# Patient Record
Sex: Female | Born: 1995 | Race: White | Hispanic: No | Marital: Single | Smoking: Never smoker
Health system: Southern US, Community
[De-identification: ages and names within clinical notes are randomized; demographics above are authoritative.]

---

## 2015-11-06 ENCOUNTER — Emergency Department
Admission: EM | Admit: 2015-11-06 | Discharge: 2015-11-06 | Disposition: A | Discharge status: Home / Self Care | Payer: 59 | Attending: Emergency Medicine | Admitting: Emergency Medicine

## 2015-11-06 ENCOUNTER — Encounter: Payer: Self-pay | Admitting: Emergency Medicine

## 2015-11-06 DIAGNOSIS — R197 Diarrhea, unspecified: Secondary | ICD-10-CM | POA: Insufficient documentation

## 2015-11-06 DIAGNOSIS — K5900 Constipation, unspecified: Secondary | ICD-10-CM | POA: Insufficient documentation

## 2015-11-06 DIAGNOSIS — N390 Urinary tract infection, site not specified: Secondary | ICD-10-CM

## 2015-11-06 DIAGNOSIS — R110 Nausea: Secondary | ICD-10-CM | POA: Diagnosis not present

## 2015-11-06 DIAGNOSIS — F419 Anxiety disorder, unspecified: Secondary | ICD-10-CM | POA: Insufficient documentation

## 2015-11-06 DIAGNOSIS — R252 Cramp and spasm: Secondary | ICD-10-CM | POA: Diagnosis not present

## 2015-11-06 DIAGNOSIS — R109 Unspecified abdominal pain: Secondary | ICD-10-CM | POA: Diagnosis present

## 2015-11-06 LAB — CBC WITH DIFFERENTIAL/PLATELET
BASOS ABS: 0 10*3/uL (ref 0–0.1)
BASOS PCT: 1 %
EOS PCT: 0 %
Eosinophils Absolute: 0 10*3/uL (ref 0–0.7)
HCT: 41 % (ref 35.0–47.0)
Hemoglobin: 14.2 g/dL (ref 12.0–16.0)
Lymphocytes Relative: 18 %
Lymphs Abs: 1.8 10*3/uL (ref 1.0–3.6)
MCH: 31.6 pg (ref 26.0–34.0)
MCHC: 34.5 g/dL (ref 32.0–36.0)
MCV: 91.6 fL (ref 80.0–100.0)
MONO ABS: 0.5 10*3/uL (ref 0.2–0.9)
MONOS PCT: 5 %
Neutro Abs: 7.7 10*3/uL — ABNORMAL HIGH (ref 1.4–6.5)
Neutrophils Relative %: 76 %
PLATELETS: 246 10*3/uL (ref 150–440)
RBC: 4.48 MIL/uL (ref 3.80–5.20)
RDW: 13.5 % (ref 11.5–14.5)
WBC: 10.1 10*3/uL (ref 3.6–11.0)

## 2015-11-06 LAB — COMPREHENSIVE METABOLIC PANEL
ALBUMIN: 4.7 g/dL (ref 3.5–5.0)
ALK PHOS: 51 U/L (ref 38–126)
ALT: 15 U/L (ref 14–54)
AST: 20 U/L (ref 15–41)
Anion gap: 8 (ref 5–15)
BILIRUBIN TOTAL: 1.3 mg/dL — AB (ref 0.3–1.2)
BUN: 11 mg/dL (ref 6–20)
CALCIUM: 9.8 mg/dL (ref 8.9–10.3)
CO2: 24 mmol/L (ref 22–32)
Chloride: 106 mmol/L (ref 101–111)
Creatinine, Ser: 0.76 mg/dL (ref 0.44–1.00)
GFR calc Af Amer: >60 mL/min (ref >60)
GFR calc non Af Amer: >60 mL/min (ref >60)
GLUCOSE: 85 mg/dL (ref 65–99)
Potassium: 3.6 mmol/L (ref 3.5–5.1)
Sodium: 138 mmol/L (ref 135–145)
TOTAL PROTEIN: 7.2 g/dL (ref 6.5–8.1)

## 2015-11-06 LAB — URINALYSIS COMPLETE WITH MICROSCOPIC (ARMC ONLY)
BILIRUBIN URINE: NEGATIVE
GLUCOSE, UA: NEGATIVE mg/dL
Ketones, ur: NEGATIVE mg/dL
NITRITE: NEGATIVE
Protein, ur: NEGATIVE mg/dL
Specific Gravity, Urine: 1.002 — ABNORMAL LOW (ref 1.005–1.030)
pH: 7 (ref 5.0–8.0)

## 2015-11-06 LAB — TSH: TSH: 0.997 u[IU]/mL (ref 0.350–4.500)

## 2015-11-06 MED ORDER — CIPROFLOXACIN HCL 500 MG PO TABS: 500.0000 mg | ORAL_TABLET | Freq: Two times a day (BID) | ORAL | Status: AC

## 2015-11-06 NOTE — ED Provider Notes (Signed)
[facility] Emergency Department Provider Note  ____________________________________________  Time seen: Approximately 1:27 PM  I have reviewed the triage vital signs and the nursing notes.   HISTORY  Chief Complaint Abscess    HPI Vanessa Haney is a 20 y.o. female patient here today with multiple complaints. Patient has a list of 13 complaint she was to address today.Patient state 3 months ago she had a nodule lesion in her right inguinal area. Patient state she went to her clinic and was given doxycycline because he thought it was a cyst. Patient stated lesion and not getting any better she was placed on Bactrim after she finished the doxycycline. Patient stated at the 6 days of taking Bactrim she was unable to tolerate food that she felt bloated. And noticed her symptoms were worse when eating. Patient went to another practitioner blood work was done which showed bacterial infection she was placed on Keflex. She states she finished Keflex and does a decrease of pain and edema and erythema to the nausea lesion and inguinal area. Patient states she's been extremely nauseated since taking these different products and has a weight loss of approximately 10 pounds. Patient states she's been unable to regain to wait secondary to nausea with any food intake. Patient states she has a long history of IBS symptoms and is scheduled to see GI doctor tomorrow. Patient also complaining of intermittent mass above her umbilicus. Patient also complaining of cramping to the right leg. No palliative measures taken for these complaints.   History reviewed. No pertinent past medical history.  There are no active problems to display for this patient.   History reviewed. No pertinent past surgical history.  Current Outpatient Rx  Name  Route  Sig  Dispense  Refill  . ciprofloxacin (CIPRO) 500 MG tablet   Oral   Take 1 tablet (500 mg total) by mouth 2 (two) times daily.   20  tablet   0     Allergies Review of patient's allergies indicates no known allergies.  No family history on file.  Social History Social History  Substance Use Topics  . Smoking status: Never Smoker   . Smokeless tobacco: None  . Alcohol Use: No    Review of Systems Constitutional: No fever/chills Eyes: No visual changes. ENT: No sore throat. Cardiovascular: Denies chest pain. Respiratory: Denies shortness of breath. Gastrointestinal: Abdominal pain. Nausea without vomiting.  Intermittent diarrhea.  Intermittent constipation  Genitourinary: Negative for dysuria. Musculoskeletal: Negative for back pain. Skin: Negative for rash. Neurological: Negative for headaches, focal weakness or numbness.    ____________________________________________   PHYSICAL EXAM:  VITAL SIGNS: ED Triage Vitals  Enc Vitals Group     BP 11/06/15 1203 135/84 mmHg     Pulse Rate 11/06/15 1203 78     Resp 11/06/15 1203 16     Temp 11/06/15 1203 98.5 F (36.9 C)     Temp Source 11/06/15 1203 Oral     SpO2 11/06/15 1203 95 %     Weight 11/06/15 1203 131 lb (59.421 kg)     Height 11/06/15 1203  (1.727 m)     Head Cir --      Peak Flow --      Pain Score 11/06/15 1203 4     Pain Loc --      Pain Edu? --      Excl. in GC? --     Constitutional: Alert and oriented. Well appearing and in no acute distress. He  is anxious Eyes: Conjunctivae are normal. PERRL. EOMI. Head: Atraumatic. Nose: No congestion/rhinnorhea. Mouth/Throat: Mucous membranes are moist.  Oropharynx non-erythematous. Neck: No stridor.  No cervical spine tenderness to palpation. Hematological/Lymphatic/Immunilogical: No cervical lymphadenopathy. Cardiovascular: Normal rate, regular rhythm. Grossly normal heart sounds.  Good peripheral circulation. Respiratory: Normal respiratory effort.  No retractions. Lungs CTAB. Gastrointestinal: Soft and nontender. No distention. No abdominal bruits. No CVA  tenderness. Musculoskeletal: No lower extremity tenderness nor edema.  No joint effusions. Neurologic:  Normal speech and language. No gross focal neurologic deficits are appreciated. No gait instability. Skin:  Skin is warm, dry and intact. No rash noted. Psychiatric: Mood and affect are normal. Speech and behavior are normal.  ____________________________________________   LABS (all labs ordered are listed, but only abnormal results are displayed)  Labs Reviewed  URINALYSIS COMPLETEWITH MICROSCOPIC ([facility] ONLY) - Abnormal; Notable for the following:    Color, Urine STRAW (*)    APPearance HAZY (*)    Specific Gravity, Urine 1.002 (*)    Hgb urine dipstick 1+ (*)    Leukocytes, UA 2+ (*)    Bacteria, UA FEW (*)    Squamous Epithelial / LPF 0-5 (*)    All other components within normal limits  COMPREHENSIVE METABOLIC PANEL - Abnormal; Notable for the following:    Total Bilirubin 1.3 (*)    All other components within normal limits  CBC WITH DIFFERENTIAL/PLATELET - Abnormal; Notable for the following:    Neutro Abs 7.7 (*)    All other components within normal limits  TSH   ____________________________________________  EKG   ____________________________________________  RADIOLOGY   ____________________________________________   PROCEDURES  Procedure(s) performed: None  Critical Care performed: No  ____________________________________________   INITIAL IMPRESSION / ASSESSMENT AND PLAN / ED COURSE  Pertinent labs & imaging results that were available during my care of the patient were reviewed by me and considered in my medical decision making (see chart for details).  UTI. Discussed the results with patient. Advised patient to follow-up with scheduled GI appointment tomorrow morning. Patient given prescription for Cipro to take for urinary tract infection about MG retested after 10 days. ____________________________________________   FINAL CLINICAL  IMPRESSION(S) / ED DIAGNOSES  Final diagnoses:  UTI (lower urinary tract infection)      Joni Reiningonald K Amarion Portell, PA-C 11/06/15 2127  Minna AntisKevin Paduchowski, MD 11/07/15 [phone]

## 2015-11-06 NOTE — ED Notes (Signed)
Pt with possible abscess to naval area.

## 2015-11-06 NOTE — ED Notes (Signed)
States she was placed on septra  For abscess . Became extremely nauseated and taken off  Placed on different antibiotic for abscess and bacteria in blood.   Feels like the infection is better but conts to have extreme nausea  Unable to eat feels bloated .symptoms's are worse when eating.

## 2015-11-24 ENCOUNTER — Other Ambulatory Visit: Payer: Self-pay | Admitting: Family Medicine

## 2015-11-24 DIAGNOSIS — R1900 Intra-abdominal and pelvic swelling, mass and lump, unspecified site: Secondary | ICD-10-CM

## 2015-11-24 DIAGNOSIS — R634 Abnormal weight loss: Secondary | ICD-10-CM

## 2015-11-24 DIAGNOSIS — R1013 Epigastric pain: Secondary | ICD-10-CM

## 2015-11-24 DIAGNOSIS — R197 Diarrhea, unspecified: Secondary | ICD-10-CM

## 2015-11-27 ENCOUNTER — Ambulatory Visit: Admission: RE | Admit: 2015-11-27 | Payer: 59 | Source: Ambulatory Visit

## 2015-11-27 ENCOUNTER — Other Ambulatory Visit: Payer: Self-pay | Admitting: Family Medicine

## 2015-11-27 DIAGNOSIS — R1900 Intra-abdominal and pelvic swelling, mass and lump, unspecified site: Secondary | ICD-10-CM

## 2015-11-27 DIAGNOSIS — R197 Diarrhea, unspecified: Secondary | ICD-10-CM

## 2015-11-27 DIAGNOSIS — R634 Abnormal weight loss: Secondary | ICD-10-CM

## 2015-11-27 DIAGNOSIS — R1013 Epigastric pain: Secondary | ICD-10-CM

## 2015-11-29 ENCOUNTER — Ambulatory Visit
Admission: RE | Admit: 2015-11-29 | Discharge: 2015-11-29 | Disposition: A | Discharge status: Home / Self Care | Payer: 59 | Source: Ambulatory Visit | Attending: Family Medicine | Admitting: Family Medicine

## 2015-11-29 DIAGNOSIS — R1013 Epigastric pain: Secondary | ICD-10-CM | POA: Insufficient documentation

## 2015-11-29 DIAGNOSIS — R634 Abnormal weight loss: Secondary | ICD-10-CM | POA: Diagnosis not present

## 2015-11-29 DIAGNOSIS — R1900 Intra-abdominal and pelvic swelling, mass and lump, unspecified site: Secondary | ICD-10-CM

## 2015-11-29 DIAGNOSIS — R197 Diarrhea, unspecified: Secondary | ICD-10-CM | POA: Diagnosis present

## 2018-02-27 IMAGING — US US ABDOMEN COMPLETE
1 series · 14 of 25 positions shown · non-contrast
Comparison: None.

CLINICAL DATA: Right lower quadrant abdominal pain, weight loss,
dyspepsia

EXAM:
ABDOMEN ULTRASOUND COMPLETE

[Series 1: us abdomen complete · 0.19mm/px · 14 of 91 slices shown]
[im 1/91]
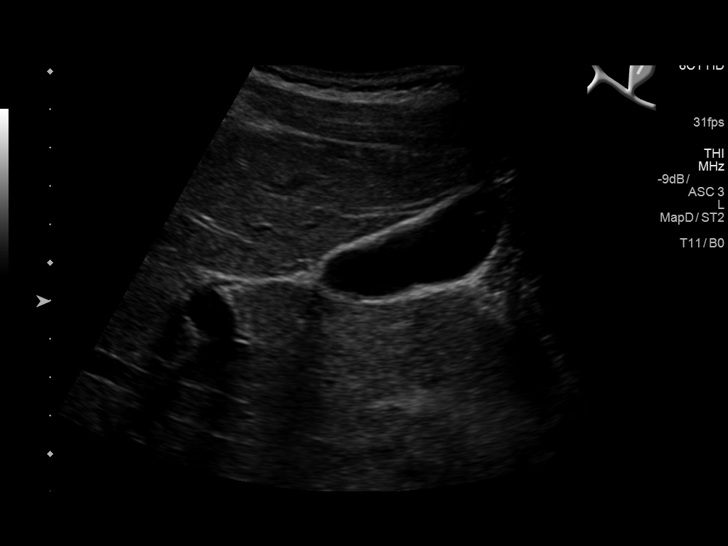
[im 8/91]
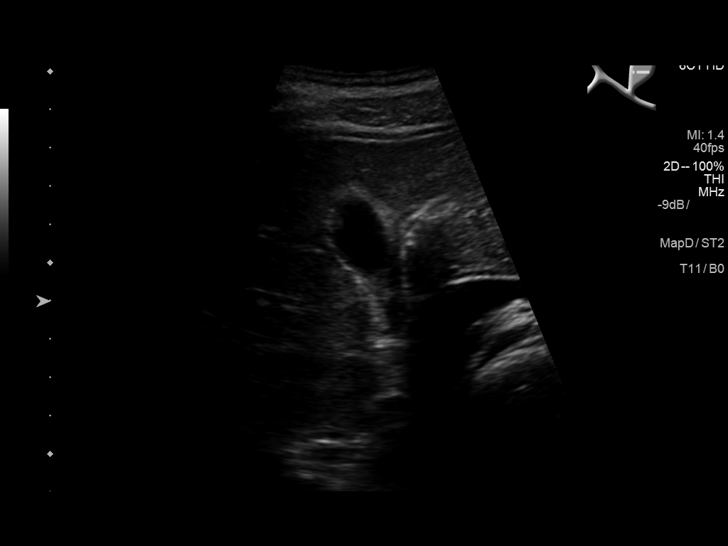
[im 16/91]
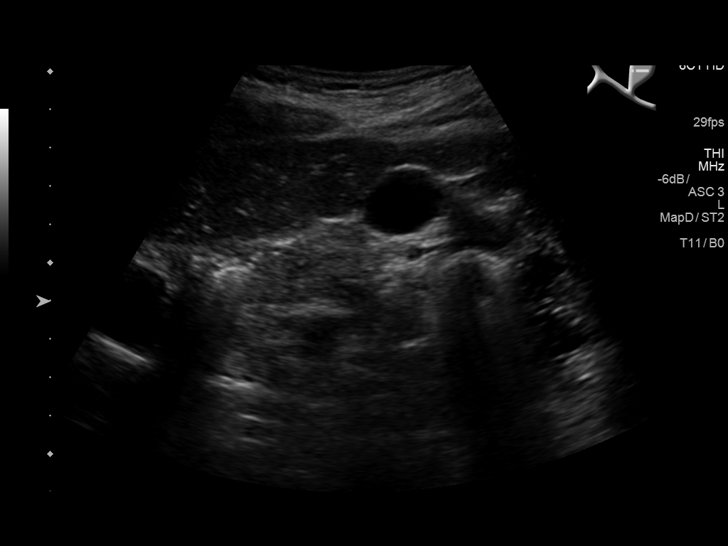
[im 23/91]
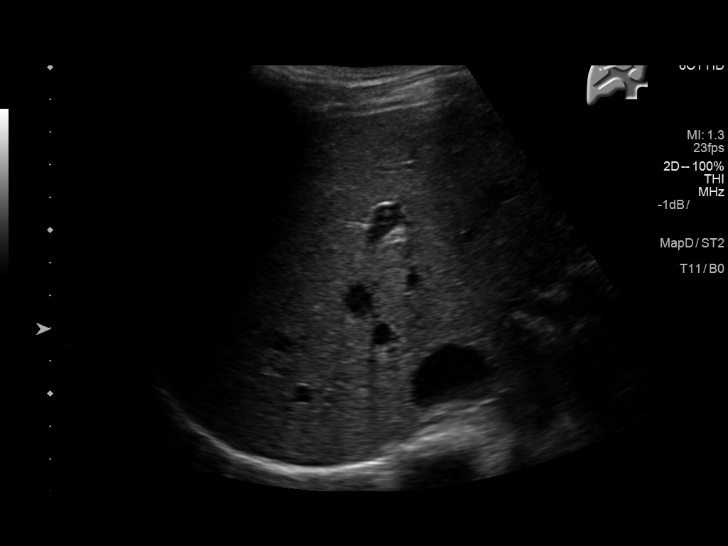
[im 31/91]
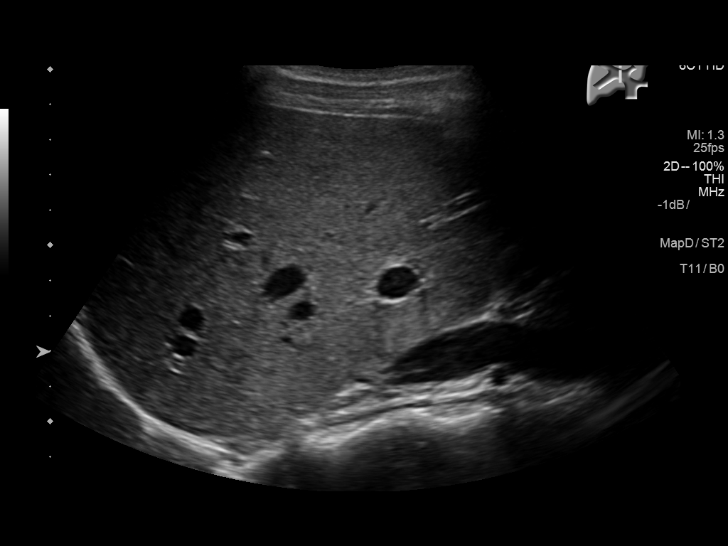
[im 34/91]
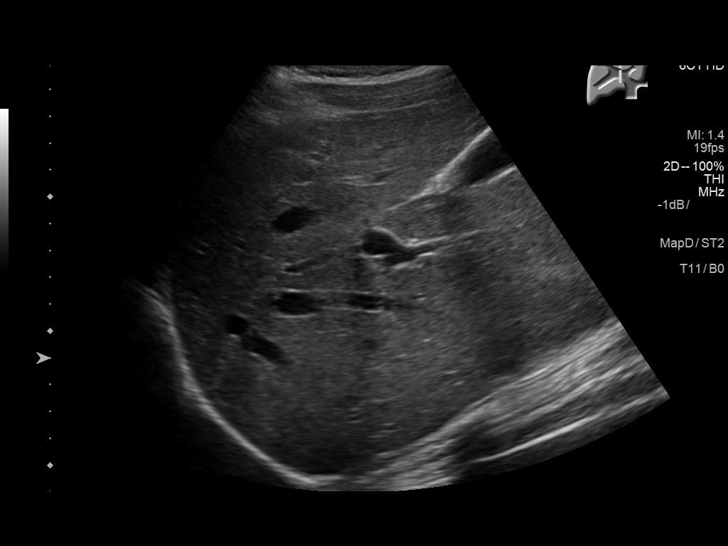
[im 42/91]
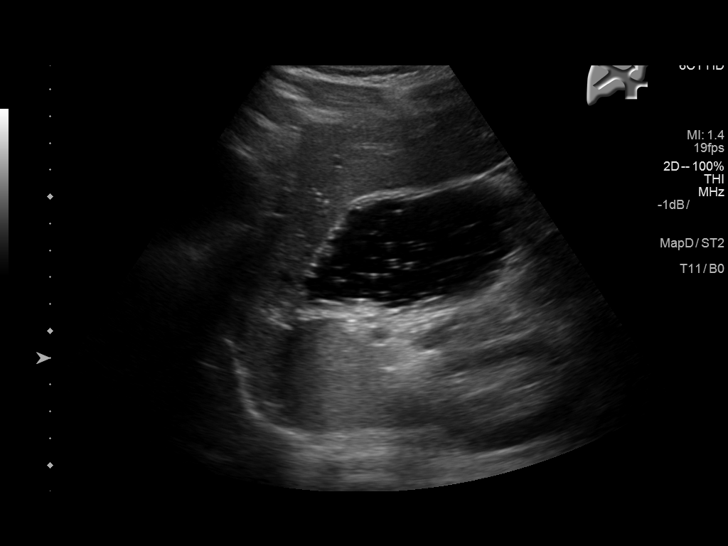
[im 49/91]
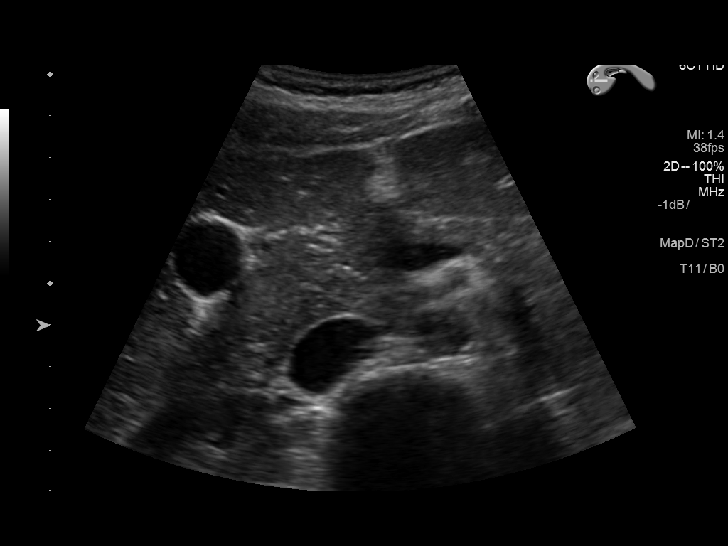
[im 57/91]
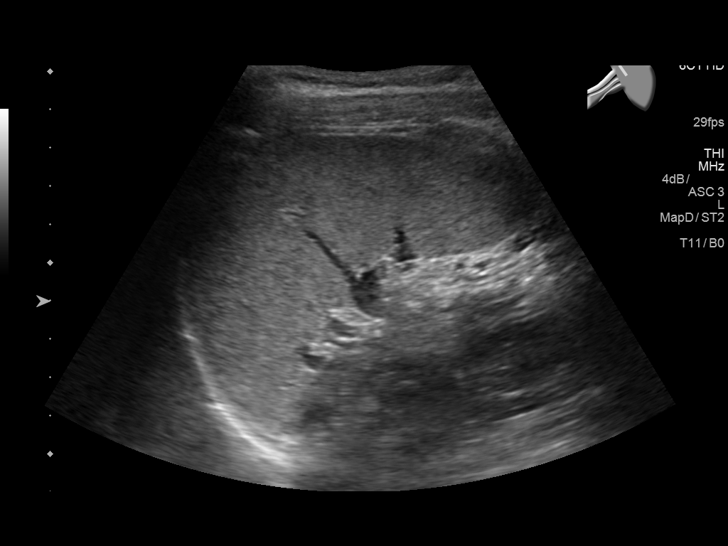
[im 61/91]
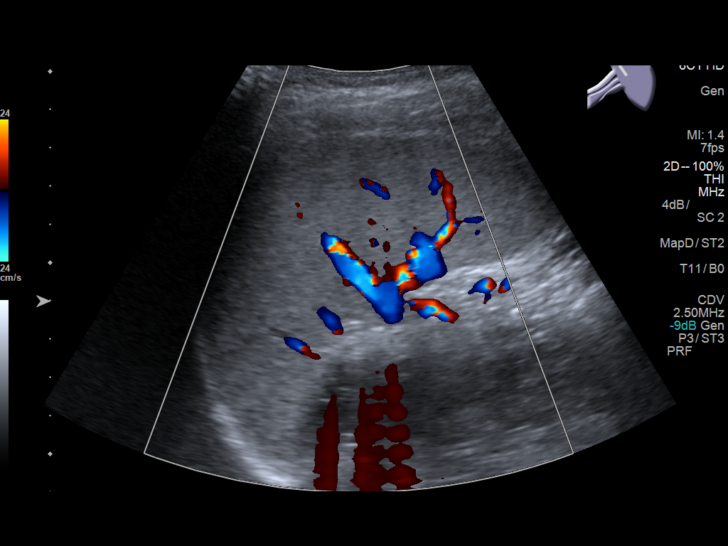
[im 68/91]
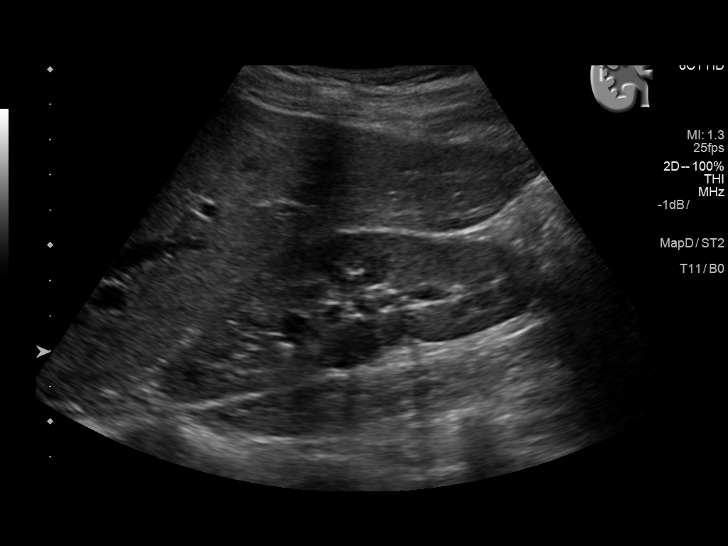
[im 76/91]
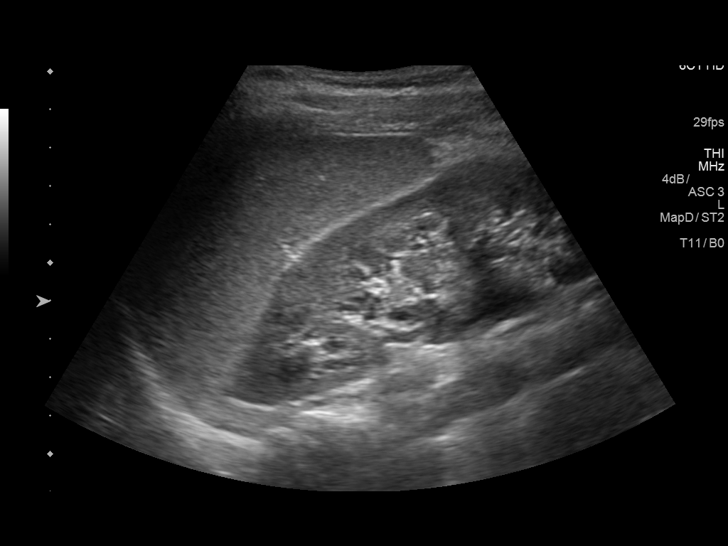
[im 83/91]
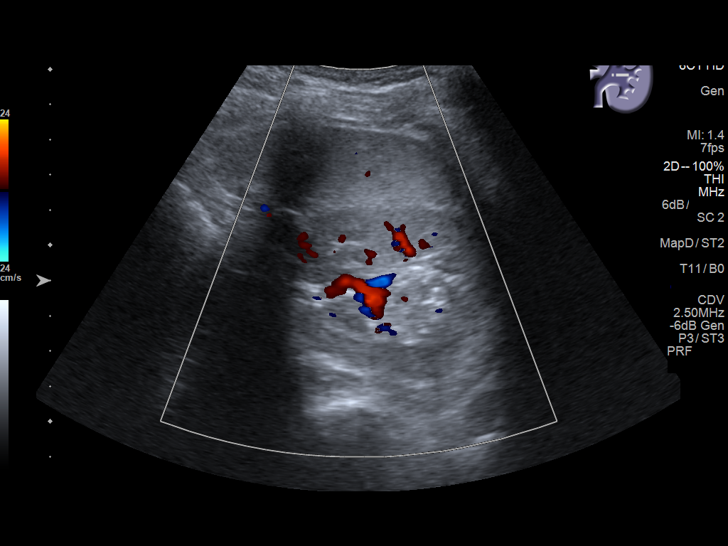
[im 91/91]
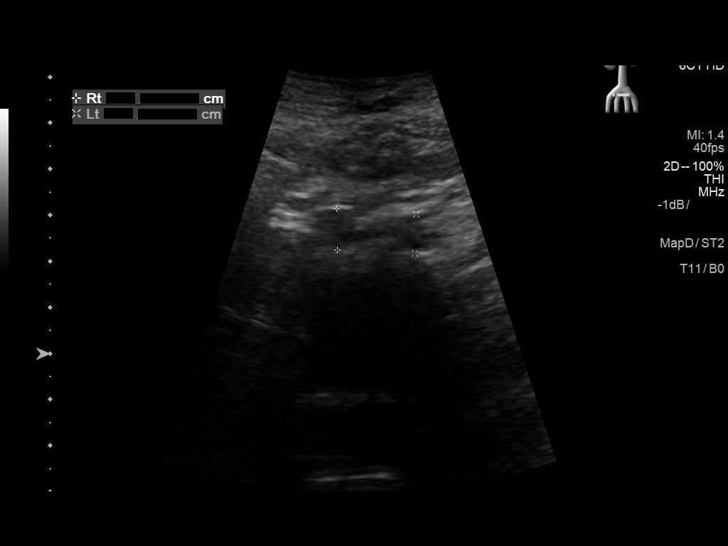

[14 of 25 positions shown; findings below may reference images not displayed]

FINDINGS: Gallbladder: No gallstones, gallbladder wall thickening, or
pericholecystic fluid. Negative sonographic Murphy's sign.

Common bile duct: Diameter: 3 mm

Liver: No focal lesion identified. Within normal limits in
parenchymal echogenicity.

IVC: No abnormality visualized.

Pancreas: Visualized portion unremarkable.

Spleen: Size and appearance within normal limits.

Right Kidney: Length: 11.7 cm.  No mass or hydronephrosis.

Left Kidney: Length: 12.5 cm.  No mass or hydronephrosis.

Abdominal aorta: No aneurysm visualized.

Other findings: None.
IMPRESSION: Negative abdominal ultrasound.
